# Patient Record
Sex: Female | Born: 1940 | Race: Black or African American | Hispanic: No | Marital: Married
Health system: Southern US, Community
[De-identification: ages and names within clinical notes are randomized; demographics above are authoritative.]

## PROBLEM LIST (undated history)

## (undated) DIAGNOSIS — I10 Essential (primary) hypertension: Secondary | ICD-10-CM

## (undated) DIAGNOSIS — R0989 Other specified symptoms and signs involving the circulatory and respiratory systems: Secondary | ICD-10-CM

## (undated) HISTORY — DX: Essential (primary) hypertension: I10

## (undated) HISTORY — DX: Other specified symptoms and signs involving the circulatory and respiratory systems: R09.89

## (undated) HISTORY — PX: PARTIAL HYSTERECTOMY: SHX80

---

## 2000-08-17 ENCOUNTER — Encounter: Admission: RE | Admit: 2000-08-17 | Discharge: 2000-08-17 | Payer: Self-pay | Admitting: Family Medicine

## 2000-08-17 ENCOUNTER — Encounter: Payer: Self-pay | Admitting: Family Medicine

## 2002-08-20 ENCOUNTER — Encounter: Payer: Self-pay | Admitting: Family Medicine

## 2002-08-20 ENCOUNTER — Encounter: Admission: RE | Admit: 2002-08-20 | Discharge: 2002-08-20 | Payer: Self-pay | Admitting: Family Medicine

## 2006-02-11 ENCOUNTER — Encounter: Admission: RE | Admit: 2006-02-11 | Discharge: 2006-02-11 | Payer: Self-pay | Admitting: Family Medicine

## 2006-04-01 ENCOUNTER — Encounter: Admission: RE | Admit: 2006-04-01 | Discharge: 2006-04-01 | Payer: Self-pay | Admitting: Family Medicine

## 2007-05-02 ENCOUNTER — Encounter: Admission: RE | Admit: 2007-05-02 | Discharge: 2007-05-02 | Payer: Self-pay | Admitting: Family Medicine

## 2008-05-23 ENCOUNTER — Encounter: Admission: RE | Admit: 2008-05-23 | Discharge: 2008-05-23 | Payer: Self-pay | Admitting: Family Medicine

## 2008-06-20 ENCOUNTER — Encounter: Admission: RE | Admit: 2008-06-20 | Discharge: 2008-06-20 | Payer: Self-pay | Admitting: Family Medicine

## 2009-12-08 ENCOUNTER — Encounter: Admission: RE | Admit: 2009-12-08 | Discharge: 2009-12-08 | Payer: Self-pay | Admitting: Family Medicine

## 2011-06-30 ENCOUNTER — Encounter: Payer: Self-pay | Admitting: Podiatry

## 2011-06-30 DIAGNOSIS — L03119 Cellulitis of unspecified part of limb: Secondary | ICD-10-CM | POA: Insufficient documentation

## 2011-06-30 DIAGNOSIS — R0989 Other specified symptoms and signs involving the circulatory and respiratory systems: Secondary | ICD-10-CM

## 2011-06-30 DIAGNOSIS — I10 Essential (primary) hypertension: Secondary | ICD-10-CM | POA: Insufficient documentation

## 2011-06-30 DIAGNOSIS — L02619 Cutaneous abscess of unspecified foot: Secondary | ICD-10-CM

## 2011-08-24 ENCOUNTER — Ambulatory Visit
Admission: RE | Admit: 2011-08-24 | Discharge: 2011-08-24 | Disposition: A | Discharge status: Home / Self Care | Payer: Medicare Other | Source: Ambulatory Visit | Attending: Family Medicine | Admitting: Family Medicine

## 2011-08-24 ENCOUNTER — Other Ambulatory Visit: Payer: Self-pay | Admitting: Family Medicine

## 2011-08-24 DIAGNOSIS — Z1231 Encounter for screening mammogram for malignant neoplasm of breast: Secondary | ICD-10-CM

## 2011-08-30 ENCOUNTER — Other Ambulatory Visit: Payer: Self-pay | Admitting: Family Medicine

## 2011-09-07 ENCOUNTER — Ambulatory Visit
Admission: RE | Admit: 2011-09-07 | Discharge: 2011-09-07 | Disposition: A | Discharge status: Home / Self Care | Payer: Medicare Other | Source: Ambulatory Visit | Attending: Family Medicine | Admitting: Family Medicine

## 2012-12-01 ENCOUNTER — Other Ambulatory Visit: Payer: Self-pay | Admitting: Family Medicine

## 2012-12-01 DIAGNOSIS — Z1231 Encounter for screening mammogram for malignant neoplasm of breast: Secondary | ICD-10-CM

## 2012-12-05 ENCOUNTER — Ambulatory Visit
Admission: RE | Admit: 2012-12-05 | Discharge: 2012-12-05 | Disposition: A | Discharge status: Home / Self Care | Payer: Medicare Other | Source: Ambulatory Visit | Attending: Family Medicine | Admitting: Family Medicine

## 2012-12-05 DIAGNOSIS — Z1231 Encounter for screening mammogram for malignant neoplasm of breast: Secondary | ICD-10-CM

## 2012-12-06 DIAGNOSIS — I10 Essential (primary) hypertension: Secondary | ICD-10-CM | POA: Diagnosis not present

## 2012-12-06 DIAGNOSIS — E7800 Pure hypercholesterolemia, unspecified: Secondary | ICD-10-CM | POA: Diagnosis not present

## 2012-12-06 DIAGNOSIS — E559 Vitamin D deficiency, unspecified: Secondary | ICD-10-CM | POA: Diagnosis not present

## 2012-12-06 DIAGNOSIS — Z23 Encounter for immunization: Secondary | ICD-10-CM | POA: Diagnosis not present

## 2012-12-06 DIAGNOSIS — Z79899 Other long term (current) drug therapy: Secondary | ICD-10-CM | POA: Diagnosis not present

## 2012-12-06 DIAGNOSIS — J309 Allergic rhinitis, unspecified: Secondary | ICD-10-CM | POA: Diagnosis not present

## 2012-12-12 ENCOUNTER — Ambulatory Visit: Payer: Medicare Other

## 2013-01-18 ENCOUNTER — Emergency Department (INDEPENDENT_AMBULATORY_CARE_PROVIDER_SITE_OTHER)
Admission: EM | Admit: 2013-01-18 | Discharge: 2013-01-18 | Disposition: A | Discharge status: Home / Self Care | Payer: Medicare Other | Source: Home / Self Care | Attending: Family Medicine | Admitting: Family Medicine

## 2013-01-18 ENCOUNTER — Encounter (HOSPITAL_COMMUNITY): Payer: Self-pay | Admitting: *Deleted

## 2013-01-18 DIAGNOSIS — K089 Disorder of teeth and supporting structures, unspecified: Secondary | ICD-10-CM

## 2013-01-18 MED ORDER — PENICILLIN V POTASSIUM 500 MG PO TABS: 500.0000 mg | ORAL_TABLET | Freq: Two times a day (BID) | ORAL | Status: AC

## 2013-01-18 MED ORDER — BENZOCAINE 20 % MT PSTE: 1.0000 "application " | PASTE | Freq: Three times a day (TID) | OROMUCOSAL | Status: DC | PRN

## 2013-01-18 MED ORDER — TRAMADOL HCL 50 MG PO TABS: 50.0000 mg | ORAL_TABLET | Freq: Four times a day (QID) | ORAL | Status: DC | PRN

## 2013-01-18 NOTE — ED Notes (Signed)
Pt  Reports  Symptoms  Of  Toothache     r  Lower  Side    She   Reports  The  Symptoms  Began yesterday    She  Has  Not  Seen a  Dentist  For these  Symptoms     She is  Sitting  uprihjt  On  Exam table  Speaking in  Complete  sentances

## 2013-01-18 NOTE — ED Provider Notes (Signed)
History     CSN: [id number]  Arrival date & time 01/18/13  1150   First MD Initiated Contact with Patient 01/18/13 1230      Chief Complaint  Patient presents with  . Dental Pain    (Consider location/radiation/quality/duration/timing/severity/associated sxs/prior treatment) HPI Comments: 72 year old female with history of hypertension. Here complaining of dental pain in her right lower jaw since yesterday. Patient states she was eating chicken and bit the bone causing one of her tooth break few days ago. There has been some debris accumulated in the cavity and start to feel pain yesterday. Denies swelling or drainage. Denies fever or chills. She tried to be seen in the dental office today but they didn't accept Medicare. Not taking any medication for pain.   Past Medical History  Diagnosis Date  . Sinus complaint   . Hypertension     Past Surgical History  Procedure Laterality Date  . Partial hysterectomy      No family history on file.  History  Substance Use Topics  . Smoking status: Unknown If Ever Smoked  . Smokeless tobacco: Not on file  . Alcohol Use: No    OB History   Grav Para Term Preterm Abortions TAB SAB Ect Mult Living                  Review of Systems  Constitutional: Negative for fever, chills and appetite change.  HENT: Negative for ear pain, sore throat, facial swelling and neck pain.   Respiratory: Negative for cough.   Neurological: Negative for headaches.    Allergies  Codeine and Tylenol  Home Medications   Current Outpatient Rx  Name  Route  Sig  Dispense  Refill  . benzocaine (ORABASE-B) 20 % PSTE   Mouth/Throat   Use as directed 1 application in the mouth or throat 3 (three) times daily as needed.   1 each   0   . HYDROCHLOROTHIAZIDE PO   Oral   Take by mouth.           Marland Kitchen LISINOPRIL PO   Oral   Take by mouth.           . Nutritional Supplements (VITAMIN D BOOSTER PO)   Oral   Take by mouth.           .  penicillin v potassium (VEETID) 500 MG tablet   Oral   Take 1 tablet (500 mg total) by mouth 2 (two) times daily before a meal.   14 tablet   0   . traMADol (ULTRAM) 50 MG tablet   Oral   Take 1 tablet (50 mg total) by mouth every 6 (six) hours as needed for pain.   15 tablet   0     BP 144/62  Pulse 76  Temp(Src) 98.5 F (36.9 C) (Oral)  Resp 16  SpO2 100%  Physical Exam  Nursing note and vitals reviewed. Constitutional: She is oriented to person, place, and time. She appears well-developed and well-nourished. No distress.  HENT:  Head: Normocephalic and atraumatic.  Right Ear: External ear normal.  Left Ear: External ear normal.  Mouth/Throat: Oropharynx is clear and moist. No oropharyngeal exudate.  There is a fractured molar in the right lower jaw with yellow exudate in the cavity and gum irritation around. No obvious fluctuation.   Neck: Neck supple. No thyromegaly present.  Cardiovascular: Normal heart sounds.   Pulmonary/Chest: Breath sounds normal.  Lymphadenopathy:    She has no cervical adenopathy.  Neurological: She is alert and oriented to person, place, and time.  Skin: No rash noted. She is not diaphoretic.    ED Course  Procedures (including critical care time)  Labs Reviewed - No data to display No results found.   1. Pain, dental       MDM  Decided to cover with penicillin for possible infection as already accumulating yellow debrie in the fractured cavity. Also prescribed tramadol and Orabase for pain. Arts development officer provided. Encouraged dental followup. Supportive care and red flags that should prompt her return to medical attention discussed with patient and provided in writing.        Sharin Grave, MD 01/20/13 1032

## 2013-06-14 ENCOUNTER — Other Ambulatory Visit: Payer: Self-pay | Admitting: Family Medicine

## 2013-06-14 DIAGNOSIS — M810 Age-related osteoporosis without current pathological fracture: Secondary | ICD-10-CM | POA: Diagnosis not present

## 2013-06-14 DIAGNOSIS — Z79899 Other long term (current) drug therapy: Secondary | ICD-10-CM | POA: Diagnosis not present

## 2013-06-14 DIAGNOSIS — E559 Vitamin D deficiency, unspecified: Secondary | ICD-10-CM | POA: Diagnosis not present

## 2013-06-14 DIAGNOSIS — I10 Essential (primary) hypertension: Secondary | ICD-10-CM | POA: Diagnosis not present

## 2013-06-14 DIAGNOSIS — Z1331 Encounter for screening for depression: Secondary | ICD-10-CM | POA: Diagnosis not present

## 2013-06-14 DIAGNOSIS — Z0000 Encounter for general adult medical examination without abnormal findings: Secondary | ICD-10-CM | POA: Diagnosis not present

## 2013-06-14 DIAGNOSIS — E7800 Pure hypercholesterolemia, unspecified: Secondary | ICD-10-CM | POA: Diagnosis not present

## 2013-08-22 DIAGNOSIS — E559 Vitamin D deficiency, unspecified: Secondary | ICD-10-CM | POA: Diagnosis not present

## 2013-09-07 ENCOUNTER — Ambulatory Visit
Admission: RE | Admit: 2013-09-07 | Discharge: 2013-09-07 | Disposition: A | Discharge status: Home / Self Care | Payer: Medicare Other | Source: Ambulatory Visit | Attending: Family Medicine | Admitting: Family Medicine

## 2013-09-07 DIAGNOSIS — M810 Age-related osteoporosis without current pathological fracture: Secondary | ICD-10-CM | POA: Diagnosis not present

## 2013-09-07 DIAGNOSIS — M899 Disorder of bone, unspecified: Secondary | ICD-10-CM | POA: Diagnosis not present

## 2013-10-31 DIAGNOSIS — K5730 Diverticulosis of large intestine without perforation or abscess without bleeding: Secondary | ICD-10-CM | POA: Diagnosis not present

## 2013-10-31 DIAGNOSIS — Z86010 Personal history of colonic polyps: Secondary | ICD-10-CM | POA: Diagnosis not present

## 2013-10-31 DIAGNOSIS — D126 Benign neoplasm of colon, unspecified: Secondary | ICD-10-CM | POA: Diagnosis not present

## 2013-10-31 DIAGNOSIS — Z09 Encounter for follow-up examination after completed treatment for conditions other than malignant neoplasm: Secondary | ICD-10-CM | POA: Diagnosis not present

## 2013-12-13 DIAGNOSIS — E7800 Pure hypercholesterolemia, unspecified: Secondary | ICD-10-CM | POA: Diagnosis not present

## 2013-12-13 DIAGNOSIS — M810 Age-related osteoporosis without current pathological fracture: Secondary | ICD-10-CM | POA: Diagnosis not present

## 2013-12-13 DIAGNOSIS — E559 Vitamin D deficiency, unspecified: Secondary | ICD-10-CM | POA: Diagnosis not present

## 2013-12-13 DIAGNOSIS — I10 Essential (primary) hypertension: Secondary | ICD-10-CM | POA: Diagnosis not present

## 2013-12-26 ENCOUNTER — Ambulatory Visit
Admission: RE | Admit: 2013-12-26 | Discharge: 2013-12-26 | Disposition: A | Discharge status: Home / Self Care | Payer: Medicare Other | Source: Ambulatory Visit

## 2013-12-26 ENCOUNTER — Other Ambulatory Visit: Payer: Self-pay

## 2013-12-26 DIAGNOSIS — Z1231 Encounter for screening mammogram for malignant neoplasm of breast: Secondary | ICD-10-CM

## 2014-04-24 ENCOUNTER — Encounter (HOSPITAL_COMMUNITY): Payer: Self-pay | Admitting: Emergency Medicine

## 2014-04-24 ENCOUNTER — Emergency Department (HOSPITAL_COMMUNITY)
Admission: EM | Admit: 2014-04-24 | Discharge: 2014-04-25 | Disposition: A | Discharge status: Home / Self Care | Payer: Medicare Other | Attending: Emergency Medicine | Admitting: Emergency Medicine

## 2014-04-24 DIAGNOSIS — I10 Essential (primary) hypertension: Secondary | ICD-10-CM | POA: Diagnosis not present

## 2014-04-24 DIAGNOSIS — K029 Dental caries, unspecified: Secondary | ICD-10-CM | POA: Insufficient documentation

## 2014-04-24 DIAGNOSIS — K089 Disorder of teeth and supporting structures, unspecified: Secondary | ICD-10-CM | POA: Diagnosis not present

## 2014-04-24 DIAGNOSIS — Z79899 Other long term (current) drug therapy: Secondary | ICD-10-CM | POA: Diagnosis not present

## 2014-04-24 DIAGNOSIS — K0889 Other specified disorders of teeth and supporting structures: Secondary | ICD-10-CM

## 2014-04-24 NOTE — ED Notes (Signed)
Pt. reports right low molar pain onset this morning .

## 2014-04-24 NOTE — ED Provider Notes (Signed)
CSN: [id number]     Arrival date & time 04/24/14  2234 History  This chart was scribed for non-physician practitioner Monico Blitz, PA-C working with [place], DO by Rolanda Lundborg, ED Scribe. This patient was seen in room TR09C/TR09C and the patient's care was started at 11:49 PM.    Chief Complaint  Patient presents with  . Dental Pain   The history is provided by the patient. No language interpreter was used.   HPI Comments: Kathy Bond is a 73 y.o. female who presents to the Emergency Department complaining of severe, gradually-worsening right lower molar pain since yesterday. She took an Advil this morning and prescription pain medicine this afternoon. She is allergic to tylenol and codeine. Denies fever/chills, difficulty opening jaw, difficulty swallowing, SOB, gum swelling, facial swelling, neck swelling.     Past Medical History  Diagnosis Date  . Sinus complaint   . Hypertension    Past Surgical History  Procedure Laterality Date  . Partial hysterectomy     No family history on file. History  Substance Use Topics  . Smoking status: Unknown If Ever Smoked  . Smokeless tobacco: Not on file  . Alcohol Use: No   OB History   Grav Para Term Preterm Abortions TAB SAB Ect Mult Living                 Review of Systems  Constitutional: Negative for fever.  HENT: Positive for dental problem.   All other systems reviewed and are negative.     Allergies  Codeine and Tylenol  Home Medications   Prior to Admission medications   Medication Sig Start Date End Date Taking? Authorizing Provider  LISINOPRIL PO Take by mouth.      Historical Provider, MD   BP 179/76  Pulse 80  Temp(Src) 98.5 F (36.9 C) (Oral)  Resp 20  SpO2 99% Physical Exam  Nursing note and vitals reviewed. Constitutional: She is oriented to person, place, and time. She appears well-developed and well-nourished. No distress.  HENT:  Head: Normocephalic.  Mouth/Throat:     Generally good dentition, no gingival swelling, erythema or tenderness to palpation. Patient is handling their secretions. There is no tenderness to palpation or firmness underneath tongue bilaterally. No trismus.    Eyes: Conjunctivae and EOM are normal.  Cardiovascular: Normal rate.   Pulmonary/Chest: Effort normal. No stridor.  Musculoskeletal: Normal range of motion.  Neurological: She is alert and oriented to person, place, and time.  Psychiatric: She has a normal mood and affect.    ED Course  Procedures (including critical care time) Medications  amoxicillin (AMOXIL) capsule 500 mg (500 mg Oral Given 04/25/14 0118)  oxyCODONE (Oxy IR/ROXICODONE) immediate release tablet 2.5 mg (2.5 mg Oral Given 04/25/14 0118)    DIAGNOSTIC STUDIES: Oxygen Saturation is 99% on RA, normal by my interpretation.    COORDINATION OF CARE: 12:00 AM- Discussed treatment plan with pt which includes discharge home with antibiotics and pain medication. Pt agrees to plan.    Labs Review Labs Reviewed - No data to display  Imaging Review No results found.   EKG Interpretation None      MDM   Final diagnoses:  Pain, dental    Filed Vitals:   04/24/14 2239 04/25/14 0121  BP: 179/76 171/87  Pulse: 80 62  Temp: 98.5 F (36.9 C) 98.4 F (36.9 C)  TempSrc: Oral Oral  Resp: 20 16  SpO2: 99% 99%    Medications  amoxicillin (AMOXIL) capsule  500 mg (500 mg Oral Given 04/25/14 0118)  oxyCODONE (Oxy IR/ROXICODONE) immediate release tablet 2.5 mg (2.5 mg Oral Given 04/25/14 0118)    Kathy Bond is a 73 y.o. female presenting with dental pain associated with dental cary but no signs or symptoms of dental abscess. Patient afebrile, non toxic appearing and swallowing secretions well. I gave patient referral to dentist and stressed the importance of dental follow up for ultimate management of dental pain. Patient voices understanding and is agreeable to plan.  Evaluation does not show  pathology that would require ongoing emergent intervention or inpatient treatment. Pt is hemodynamically stable and mentating appropriately. Discussed findings and plan with patient/guardian, who agrees with care plan. All questions answered. Return precautions discussed and outpatient follow up given.   Discharge Medication List as of 04/25/2014 12:30 AM    START taking these medications   Details  amoxicillin (AMOXIL) 500 MG capsule Take 1 capsule (500 mg total) by mouth 3 (three) times daily., Starting 04/25/2014, Until Discontinued, Print    oxyCODONE (ROXICODONE) 5 MG immediate release tablet Take 0.5 tablets (2.5 mg total) by mouth every 4 (four) hours as needed for severe pain., Starting 04/25/2014, Until Discontinued, Print        Note: Portions of this report may have been transcribed using voice recognition software. Every effort was made to ensure accuracy; however, inadvertent computerized transcription errors may be present   I personally performed the services described in this documentation, which was scribed in my presence. The recorded information has been reviewed and is accurate.   Monico Blitz, PA-C 04/25/14 [phone]

## 2014-04-25 MED ORDER — OXYCODONE HCL 5 MG PO TABS: 2.5000 mg | ORAL_TABLET | ORAL | Status: DC | PRN

## 2014-04-25 MED ORDER — AMOXICILLIN 500 MG PO CAPS: 500.0000 mg | ORAL_CAPSULE | Freq: Three times a day (TID) | ORAL | Status: DC

## 2014-04-25 MED ORDER — AMOXICILLIN 500 MG PO CAPS
500.0000 mg | ORAL_CAPSULE | Freq: Once | ORAL | Status: AC
  Administered 2014-04-25: 500 mg via ORAL
  Filled 2014-04-25: qty 1

## 2014-04-25 MED ORDER — OXYCODONE HCL 5 MG PO TABS
2.5000 mg | ORAL_TABLET | Freq: Once | ORAL | Status: AC
  Administered 2014-04-25: 2.5 mg via ORAL
  Filled 2014-04-25: qty 1

## 2014-04-25 NOTE — Discharge Instructions (Signed)
Take oxycodone for breakthrough pain, do not drink alcohol, drive, care for children or do other critical tasks while taking oxycodone.  Return to the emergency room for fever, change in vision, redness to the face that rapidly spreads towards the eye, nausea or vomiting, difficulty swallowing or shortness of breath.   Apply warm compresses to jaw throughout the day.    Take your antibiotics as directed and to the end of the course.   Followup with a dentist is very important for ongoing evaluation and management of recurrent dental pain. Return to emergency department for emergent changing or worsening symptoms."  Low-cost dental clinic: Kathy Bond  at [phone]**  **Kathy Bond at [phone] [address]**    You may also call [phone]  Dental Assistance If the dentist on-call cannot see you, please use the resources below:   Patients with Medicaid: [facility] [id number], [place] [address] [phone]  If unable to pay, or uninsured, contact [facility] [phone]) or [facility] [phone] in [place], [place] in [facility]) to become qualified for the adult dental clinic  Other [place] [place], [place], [place], [address]    [phone], Ext. [phone]    2nd and 4th Thursday of the month at 6:30am    10 clients each day by appointment, can sometimes see walk-in     patients if someone does not show for an appointment [place], [place], [place], [address]    [phone] [facility]- [address], [place], [place], [address]    [phone]  [place] Department- [phone] [place] [facility] Department- [phone]

## 2014-04-27 NOTE — ED Provider Notes (Signed)
Medical screening examination/treatment/procedure(s) were performed by non-physician practitioner and as supervising physician I was immediately available for consultation/collaboration.   EKG Interpretation None        Anye Brose N Carlon Davidson, DO 04/27/14 0709 

## 2014-07-08 DIAGNOSIS — I10 Essential (primary) hypertension: Secondary | ICD-10-CM | POA: Diagnosis not present

## 2014-07-08 DIAGNOSIS — Z0000 Encounter for general adult medical examination without abnormal findings: Secondary | ICD-10-CM | POA: Diagnosis not present

## 2014-07-08 DIAGNOSIS — E7800 Pure hypercholesterolemia, unspecified: Secondary | ICD-10-CM | POA: Diagnosis not present

## 2014-07-08 DIAGNOSIS — Z1331 Encounter for screening for depression: Secondary | ICD-10-CM | POA: Diagnosis not present

## 2014-07-08 DIAGNOSIS — E559 Vitamin D deficiency, unspecified: Secondary | ICD-10-CM | POA: Diagnosis not present

## 2014-07-08 DIAGNOSIS — Z23 Encounter for immunization: Secondary | ICD-10-CM | POA: Diagnosis not present

## 2015-01-09 DIAGNOSIS — I10 Essential (primary) hypertension: Secondary | ICD-10-CM | POA: Diagnosis not present

## 2015-05-12 ENCOUNTER — Other Ambulatory Visit: Payer: Self-pay

## 2015-05-12 DIAGNOSIS — Z1231 Encounter for screening mammogram for malignant neoplasm of breast: Secondary | ICD-10-CM

## 2015-06-06 ENCOUNTER — Ambulatory Visit
Admission: RE | Admit: 2015-06-06 | Discharge: 2015-06-06 | Disposition: A | Discharge status: Home / Self Care | Payer: Medicare Other | Source: Ambulatory Visit

## 2015-06-06 ENCOUNTER — Ambulatory Visit: Payer: Medicare Other

## 2015-06-06 DIAGNOSIS — Z1231 Encounter for screening mammogram for malignant neoplasm of breast: Secondary | ICD-10-CM

## 2015-07-17 DIAGNOSIS — I10 Essential (primary) hypertension: Secondary | ICD-10-CM | POA: Diagnosis not present

## 2015-07-17 DIAGNOSIS — Z0000 Encounter for general adult medical examination without abnormal findings: Secondary | ICD-10-CM | POA: Diagnosis not present

## 2015-07-17 DIAGNOSIS — E559 Vitamin D deficiency, unspecified: Secondary | ICD-10-CM | POA: Diagnosis not present

## 2015-07-17 DIAGNOSIS — Z1389 Encounter for screening for other disorder: Secondary | ICD-10-CM | POA: Diagnosis not present

## 2015-07-17 DIAGNOSIS — M810 Age-related osteoporosis without current pathological fracture: Secondary | ICD-10-CM | POA: Diagnosis not present

## 2015-07-23 ENCOUNTER — Other Ambulatory Visit: Payer: Self-pay | Admitting: Family Medicine

## 2015-07-23 DIAGNOSIS — M810 Age-related osteoporosis without current pathological fracture: Secondary | ICD-10-CM

## 2015-09-17 ENCOUNTER — Ambulatory Visit
Admission: RE | Admit: 2015-09-17 | Discharge: 2015-09-17 | Disposition: A | Discharge status: Home / Self Care | Payer: Medicare Other | Source: Ambulatory Visit | Attending: Family Medicine | Admitting: Family Medicine

## 2015-09-17 DIAGNOSIS — M810 Age-related osteoporosis without current pathological fracture: Secondary | ICD-10-CM

## 2015-10-20 ENCOUNTER — Ambulatory Visit
Admission: RE | Admit: 2015-10-20 | Discharge: 2015-10-20 | Disposition: A | Discharge status: Home / Self Care | Payer: Medicare Other | Source: Ambulatory Visit | Attending: Family Medicine | Admitting: Family Medicine

## 2015-10-20 DIAGNOSIS — M810 Age-related osteoporosis without current pathological fracture: Secondary | ICD-10-CM | POA: Diagnosis not present

## 2015-10-21 DIAGNOSIS — E876 Hypokalemia: Secondary | ICD-10-CM | POA: Diagnosis not present

## 2016-01-19 DIAGNOSIS — I10 Essential (primary) hypertension: Secondary | ICD-10-CM | POA: Diagnosis not present

## 2016-02-27 DIAGNOSIS — E876 Hypokalemia: Secondary | ICD-10-CM | POA: Diagnosis not present

## 2016-04-15 ENCOUNTER — Ambulatory Visit (INDEPENDENT_AMBULATORY_CARE_PROVIDER_SITE_OTHER): Payer: Medicare Other | Admitting: Podiatry

## 2016-04-15 ENCOUNTER — Encounter: Payer: Self-pay | Admitting: Podiatry

## 2016-04-15 ENCOUNTER — Ambulatory Visit (INDEPENDENT_AMBULATORY_CARE_PROVIDER_SITE_OTHER): Payer: Medicare Other

## 2016-04-15 VITALS — BP 124/69 | HR 66 | Resp 16 | Ht 66.0 in | Wt 154.0 lb

## 2016-04-15 DIAGNOSIS — M79672 Pain in left foot: Secondary | ICD-10-CM

## 2016-04-15 DIAGNOSIS — M21619 Bunion of unspecified foot: Secondary | ICD-10-CM | POA: Diagnosis not present

## 2016-04-15 DIAGNOSIS — M79671 Pain in right foot: Secondary | ICD-10-CM

## 2016-04-15 DIAGNOSIS — M1000 Idiopathic gout, unspecified site: Secondary | ICD-10-CM

## 2016-04-15 DIAGNOSIS — M779 Enthesopathy, unspecified: Secondary | ICD-10-CM

## 2016-04-15 MED ORDER — TRIAMCINOLONE ACETONIDE 10 MG/ML IJ SUSP
10.0000 mg | Freq: Once | INTRAMUSCULAR | Status: AC
  Administered 2016-04-15: 10 mg

## 2016-04-15 NOTE — Progress Notes (Signed)
Subjective:     Patient ID: Kathy Bond, female   DOB: 04-12-1941, 75 y.o.   MRN: [id number]  HPI patient presents with pain around the big toe joint right with inflammation fluid buildup and mild discomfort around the left first MPJ with inflammation fluid buildup noted   Review of Systems  All other systems reviewed and are negative.      Objective:   Physical Exam  Constitutional: She is oriented to person, place, and time.  Cardiovascular: Intact distal pulses.   Musculoskeletal: Normal range of motion.  Neurological: She is oriented to person, place, and time.  Skin: Skin is warm.  Nursing note and vitals reviewed.  neurovascular status intact muscle strength adequate range of motion within normal limits with patient found to have inflammation and fluid around the first MPJ right foot that's painful when pressed with minimal changes in the left foot. Patient states that at times it hard to wear shoe gear comfortably and she's never had anything like this before. Good digital perfusion was noted and well oriented     Assessment:     Inflammatory capsulitis with strong probability of gout-like symptoms right first MPJ    Plan:     H&P and x-rays reviewed with patient. Today I injected around the joint 3 mg Kenalog 5 mill grams Xylocaine and gave her instructions on foods to avoid with her gout. If this persists we will get blood work and we may need to consider oral medication if she were to get another attack  X-ray report indicates fluid buildup around the first MPJ right with mild elevation of the intermetatarsal angle

## 2016-04-15 NOTE — Progress Notes (Signed)
   Subjective:    Patient ID: Kathy Bond, female    DOB: [birth year], 75 y.o.   MRN: [id number]  HPI Chief Complaint  Patient presents with  . Toe Pain    Right foot; great toe-joint; swelling and redness      Review of Systems  HENT: Positive for sinus pressure.   All other systems reviewed and are negative.      Objective:   Physical Exam        Assessment & Plan:

## 2016-07-20 ENCOUNTER — Other Ambulatory Visit: Payer: Self-pay | Admitting: Family Medicine

## 2016-07-20 DIAGNOSIS — Z1231 Encounter for screening mammogram for malignant neoplasm of breast: Secondary | ICD-10-CM

## 2016-07-22 DIAGNOSIS — E559 Vitamin D deficiency, unspecified: Secondary | ICD-10-CM | POA: Diagnosis not present

## 2016-07-22 DIAGNOSIS — M109 Gout, unspecified: Secondary | ICD-10-CM | POA: Diagnosis not present

## 2016-07-22 DIAGNOSIS — I10 Essential (primary) hypertension: Secondary | ICD-10-CM | POA: Diagnosis not present

## 2016-07-22 DIAGNOSIS — M810 Age-related osteoporosis without current pathological fracture: Secondary | ICD-10-CM | POA: Diagnosis not present

## 2016-07-22 DIAGNOSIS — Z0000 Encounter for general adult medical examination without abnormal findings: Secondary | ICD-10-CM | POA: Diagnosis not present

## 2016-07-23 ENCOUNTER — Ambulatory Visit
Admission: RE | Admit: 2016-07-23 | Discharge: 2016-07-23 | Disposition: A | Discharge status: Home / Self Care | Payer: Medicare Other | Source: Ambulatory Visit | Attending: Family Medicine | Admitting: Family Medicine

## 2016-07-23 DIAGNOSIS — Z1231 Encounter for screening mammogram for malignant neoplasm of breast: Secondary | ICD-10-CM

## 2016-08-30 DIAGNOSIS — I10 Essential (primary) hypertension: Secondary | ICD-10-CM | POA: Diagnosis not present

## 2016-08-30 DIAGNOSIS — M109 Gout, unspecified: Secondary | ICD-10-CM | POA: Diagnosis not present

## 2016-09-29 DIAGNOSIS — M109 Gout, unspecified: Secondary | ICD-10-CM | POA: Diagnosis not present

## 2016-09-29 DIAGNOSIS — I10 Essential (primary) hypertension: Secondary | ICD-10-CM | POA: Diagnosis not present

## 2016-10-27 DIAGNOSIS — M109 Gout, unspecified: Secondary | ICD-10-CM | POA: Diagnosis not present

## 2016-12-02 DIAGNOSIS — I10 Essential (primary) hypertension: Secondary | ICD-10-CM | POA: Diagnosis not present

## 2017-01-27 DIAGNOSIS — I10 Essential (primary) hypertension: Secondary | ICD-10-CM | POA: Diagnosis not present

## 2017-03-30 DIAGNOSIS — I10 Essential (primary) hypertension: Secondary | ICD-10-CM | POA: Diagnosis not present

## 2017-04-28 DIAGNOSIS — I10 Essential (primary) hypertension: Secondary | ICD-10-CM | POA: Diagnosis not present

## 2017-04-28 DIAGNOSIS — M109 Gout, unspecified: Secondary | ICD-10-CM | POA: Diagnosis not present

## 2017-08-03 ENCOUNTER — Other Ambulatory Visit: Payer: Self-pay | Admitting: Family Medicine

## 2017-08-03 DIAGNOSIS — M1A9XX0 Chronic gout, unspecified, without tophus (tophi): Secondary | ICD-10-CM | POA: Diagnosis not present

## 2017-08-03 DIAGNOSIS — I10 Essential (primary) hypertension: Secondary | ICD-10-CM | POA: Diagnosis not present

## 2017-08-03 DIAGNOSIS — Z1231 Encounter for screening mammogram for malignant neoplasm of breast: Secondary | ICD-10-CM

## 2017-08-03 DIAGNOSIS — Z0000 Encounter for general adult medical examination without abnormal findings: Secondary | ICD-10-CM | POA: Diagnosis not present

## 2017-08-03 DIAGNOSIS — E559 Vitamin D deficiency, unspecified: Secondary | ICD-10-CM | POA: Diagnosis not present

## 2017-08-03 DIAGNOSIS — M810 Age-related osteoporosis without current pathological fracture: Secondary | ICD-10-CM | POA: Diagnosis not present

## 2017-08-04 ENCOUNTER — Other Ambulatory Visit: Payer: Self-pay | Admitting: Family Medicine

## 2017-08-04 DIAGNOSIS — M810 Age-related osteoporosis without current pathological fracture: Secondary | ICD-10-CM

## 2017-08-18 ENCOUNTER — Ambulatory Visit
Admission: RE | Admit: 2017-08-18 | Discharge: 2017-08-18 | Disposition: A | Discharge status: Home / Self Care | Payer: Medicare Other | Source: Ambulatory Visit | Attending: Family Medicine | Admitting: Family Medicine

## 2017-08-18 DIAGNOSIS — Z1231 Encounter for screening mammogram for malignant neoplasm of breast: Secondary | ICD-10-CM | POA: Diagnosis not present

## 2017-10-24 ENCOUNTER — Ambulatory Visit
Admission: RE | Admit: 2017-10-24 | Discharge: 2017-10-24 | Disposition: A | Discharge status: Home / Self Care | Payer: Medicare Other | Source: Ambulatory Visit | Attending: Family Medicine | Admitting: Family Medicine

## 2017-10-24 DIAGNOSIS — M810 Age-related osteoporosis without current pathological fracture: Secondary | ICD-10-CM | POA: Diagnosis not present

## 2017-10-24 DIAGNOSIS — Z780 Asymptomatic menopausal state: Secondary | ICD-10-CM | POA: Diagnosis not present

## 2018-02-09 DIAGNOSIS — I10 Essential (primary) hypertension: Secondary | ICD-10-CM | POA: Diagnosis not present

## 2018-02-09 DIAGNOSIS — E559 Vitamin D deficiency, unspecified: Secondary | ICD-10-CM | POA: Diagnosis not present

## 2018-02-09 DIAGNOSIS — M810 Age-related osteoporosis without current pathological fracture: Secondary | ICD-10-CM | POA: Diagnosis not present

## 2018-02-09 DIAGNOSIS — M109 Gout, unspecified: Secondary | ICD-10-CM | POA: Diagnosis not present

## 2018-08-17 DIAGNOSIS — M109 Gout, unspecified: Secondary | ICD-10-CM | POA: Diagnosis not present

## 2018-08-17 DIAGNOSIS — E559 Vitamin D deficiency, unspecified: Secondary | ICD-10-CM | POA: Diagnosis not present

## 2018-08-17 DIAGNOSIS — M810 Age-related osteoporosis without current pathological fracture: Secondary | ICD-10-CM | POA: Diagnosis not present

## 2018-08-17 DIAGNOSIS — Z0000 Encounter for general adult medical examination without abnormal findings: Secondary | ICD-10-CM | POA: Diagnosis not present

## 2018-08-17 DIAGNOSIS — I10 Essential (primary) hypertension: Secondary | ICD-10-CM | POA: Diagnosis not present

## 2018-10-23 DIAGNOSIS — J3089 Other allergic rhinitis: Secondary | ICD-10-CM | POA: Diagnosis not present

## 2018-10-23 DIAGNOSIS — S0340XA Sprain of jaw, unspecified side, initial encounter: Secondary | ICD-10-CM | POA: Diagnosis not present

## 2018-11-20 ENCOUNTER — Other Ambulatory Visit: Payer: Self-pay | Admitting: Family Medicine

## 2018-11-20 DIAGNOSIS — Z1231 Encounter for screening mammogram for malignant neoplasm of breast: Secondary | ICD-10-CM

## 2018-12-26 ENCOUNTER — Other Ambulatory Visit: Payer: Self-pay | Admitting: Family Medicine

## 2018-12-26 ENCOUNTER — Ambulatory Visit
Admission: RE | Admit: 2018-12-26 | Discharge: 2018-12-26 | Disposition: A | Discharge status: Home / Self Care | Payer: Medicare Other | Source: Ambulatory Visit | Attending: Family Medicine | Admitting: Family Medicine

## 2018-12-26 DIAGNOSIS — Z1231 Encounter for screening mammogram for malignant neoplasm of breast: Secondary | ICD-10-CM

## 2019-02-19 DIAGNOSIS — M810 Age-related osteoporosis without current pathological fracture: Secondary | ICD-10-CM | POA: Diagnosis not present

## 2019-02-19 DIAGNOSIS — M109 Gout, unspecified: Secondary | ICD-10-CM | POA: Diagnosis not present

## 2019-02-19 DIAGNOSIS — E559 Vitamin D deficiency, unspecified: Secondary | ICD-10-CM | POA: Diagnosis not present

## 2019-02-19 DIAGNOSIS — I10 Essential (primary) hypertension: Secondary | ICD-10-CM | POA: Diagnosis not present

## 2019-09-04 DIAGNOSIS — I10 Essential (primary) hypertension: Secondary | ICD-10-CM | POA: Diagnosis not present

## 2019-09-04 DIAGNOSIS — M109 Gout, unspecified: Secondary | ICD-10-CM | POA: Diagnosis not present

## 2019-09-04 DIAGNOSIS — Z23 Encounter for immunization: Secondary | ICD-10-CM | POA: Diagnosis not present

## 2019-09-04 DIAGNOSIS — E559 Vitamin D deficiency, unspecified: Secondary | ICD-10-CM | POA: Diagnosis not present

## 2019-09-04 DIAGNOSIS — M810 Age-related osteoporosis without current pathological fracture: Secondary | ICD-10-CM | POA: Diagnosis not present

## 2019-09-04 DIAGNOSIS — Z0000 Encounter for general adult medical examination without abnormal findings: Secondary | ICD-10-CM | POA: Diagnosis not present

## 2019-09-06 ENCOUNTER — Other Ambulatory Visit: Payer: Self-pay | Admitting: Family Medicine

## 2019-09-06 DIAGNOSIS — M810 Age-related osteoporosis without current pathological fracture: Secondary | ICD-10-CM

## 2020-02-08 ENCOUNTER — Other Ambulatory Visit: Payer: Self-pay | Admitting: Family Medicine

## 2020-02-08 DIAGNOSIS — Z1231 Encounter for screening mammogram for malignant neoplasm of breast: Secondary | ICD-10-CM

## 2020-09-17 ENCOUNTER — Other Ambulatory Visit: Payer: Self-pay | Admitting: Family Medicine

## 2020-09-17 DIAGNOSIS — M810 Age-related osteoporosis without current pathological fracture: Secondary | ICD-10-CM

## 2020-09-17 DIAGNOSIS — Z1231 Encounter for screening mammogram for malignant neoplasm of breast: Secondary | ICD-10-CM

## 2021-03-14 ENCOUNTER — Encounter (HOSPITAL_COMMUNITY): Payer: Self-pay | Admitting: *Deleted

## 2021-03-14 ENCOUNTER — Other Ambulatory Visit: Payer: Self-pay

## 2021-03-14 ENCOUNTER — Ambulatory Visit (HOSPITAL_COMMUNITY)
Admission: EM | Admit: 2021-03-14 | Discharge: 2021-03-14 | Disposition: A | Discharge status: Home / Self Care | Payer: Medicare Other | Attending: Medical Oncology | Admitting: Medical Oncology

## 2021-03-14 DIAGNOSIS — L2389 Allergic contact dermatitis due to other agents: Secondary | ICD-10-CM | POA: Diagnosis not present

## 2021-03-14 MED ORDER — TRIAMCINOLONE ACETONIDE 0.1 % EX CREA: 1.0000 "application " | TOPICAL_CREAM | Freq: Two times a day (BID) | CUTANEOUS | 0 refills | Status: AC

## 2021-03-14 NOTE — ED Provider Notes (Signed)
[place]    CSN: [id number] Arrival date & time: 03/14/21  1450      History   Chief Complaint Chief Complaint  Patient presents with  . skin irritation    HPI Kathy Bond is a 80 y.o. female.   HPI   Skin Rash: Pt reports that yesterday while working in the yard she started having hives and itching. Started after moving the grass. Rash occurred on legs, arms and trunk. Symptoms resolved with calamine lotion. She is concerned about Shingles so she comes for an evaluation. She denies facial swelling, trouble breathing, swallowing, SOB, wheezing.   Past Medical History:  Diagnosis Date  . Hypertension   . Sinus complaint     Patient Active Problem List   Diagnosis Date Noted  . Hypertension 06/30/2011  . Sinus complaint 06/30/2011  . Cellulitis and abscess of foot 06/30/2011    Past Surgical History:  Procedure Laterality Date  . PARTIAL HYSTERECTOMY      OB History   No obstetric history on file.      Home Medications    Prior to Admission medications   Medication Sig Start Date End Date Taking? Authorizing Provider  hydrochlorothiazide (HYDRODIURIL) 25 MG tablet Take 25 mg by mouth daily.    [provider]  ibuprofen (ADVIL,MOTRIN) 200 MG tablet Take 200 mg by mouth every 6 (six) hours as needed for moderate pain.    [provider]  LISINOPRIL PO Take 1 tablet by mouth daily. Pt takes Lisinopril HCTZ 20-25 mg    [provider]  potassium chloride (MICRO-K) 10 MEQ CR capsule Take 10 mEq by mouth once a week.    [provider]  traMADol (ULTRAM) 50 MG tablet Take 50 mg by mouth every 6 (six) hours as needed for moderate pain.    [provider]  Vitamin D, Ergocalciferol, (DRISDOL) 50000 UNITS CAPS capsule Take 50,000 Units by mouth every 7 (seven) days. Mondays    [provider]    Family History Family History  Problem Relation Age of Onset  . Breast cancer Neg Hx     Social  History Social History   Tobacco Use  . Smoking status: Unknown If Ever Smoked  . Smokeless tobacco: Never Used  Substance Use Topics  . Alcohol use: No     Allergies   Codeine and Tylenol [acetaminophen]   Review of Systems Review of Systems  As stated above in HPI Physical Exam Triage Vital Signs ED Triage Vitals  Enc Vitals Group     BP 03/14/21 1522 (!) 151/68     Pulse Rate 03/14/21 1522 69     Resp 03/14/21 1522 18     Temp 03/14/21 1522 98.4 F (36.9 C)     Temp Source 03/14/21 1522 Oral     SpO2 03/14/21 1522 100 %     Weight --      Height --      Head Circumference --      Peak Flow --      Pain Score 03/14/21 1520 0     Pain Loc --      Pain Edu? --      Excl. in [place]? --    No data found.  Updated Vital Signs BP (!) 151/68 (BP Location: Right Arm)   Pulse 69   Temp 98.4 F (36.9 C) (Oral)   Resp 18   SpO2 100%   Physical Exam Vitals and nursing note reviewed.  Constitutional:      Appearance: Normal appearance.  Cardiovascular:     Rate and Rhythm: Normal rate and regular rhythm.     Heart sounds: Normal heart sounds.  Pulmonary:     Effort: Pulmonary effort is normal.     Breath sounds: Normal breath sounds.  Skin:    Findings: No rash.  Neurological:     Mental Status: She is alert.     UC Treatments / Results  Labs (all labs ordered are listed, but only abnormal results are displayed) Labs Reviewed - No data to display  EKG   Radiology No results found.  Procedures Procedures (including critical care time)  Medications Ordered in UC Medications - No data to display  Initial Impression / Assessment and Plan / UC Course  I have reviewed the triage vital signs and the nursing notes.  Pertinent labs & imaging results that were available during my care of the patient were reviewed by me and considered in my medical decision making (see chart for details).     New. Discussed contact dermatitis which is likely given  pollen and allergy season paired with history. Symptoms have resolved. Discussed red flag signs and symptoms. Triamcinolone should symptoms return. She could also trial Claritin if needed. Discussed shingles red flag signs and symptoms.   Final Clinical Impressions(s) / UC Diagnoses   Final diagnoses:  None   Discharge Instructions   None    ED Prescriptions    None     PDMP not reviewed this encounter.   Hughie Closs, [place] 03/14/21 1558

## 2021-03-14 NOTE — ED Triage Notes (Signed)
Pt reports after working in the yard yesterday having hives and itching. This has resolved today but wants to be checked out.

## 2021-05-15 ENCOUNTER — Ambulatory Visit
Admission: RE | Admit: 2021-05-15 | Discharge: 2021-05-15 | Disposition: A | Discharge status: Home / Self Care | Payer: Medicare Other | Source: Ambulatory Visit | Attending: Family Medicine | Admitting: Family Medicine

## 2021-05-15 ENCOUNTER — Other Ambulatory Visit: Payer: Self-pay | Admitting: Family Medicine

## 2021-05-15 DIAGNOSIS — R634 Abnormal weight loss: Secondary | ICD-10-CM

## 2021-05-15 DIAGNOSIS — M1A0710 Idiopathic chronic gout, right ankle and foot, without tophus (tophi): Secondary | ICD-10-CM | POA: Diagnosis not present

## 2021-05-29 ENCOUNTER — Other Ambulatory Visit (HOSPITAL_BASED_OUTPATIENT_CLINIC_OR_DEPARTMENT_OTHER): Payer: Self-pay | Admitting: Family Medicine

## 2021-05-29 DIAGNOSIS — R634 Abnormal weight loss: Secondary | ICD-10-CM

## 2021-06-02 ENCOUNTER — Other Ambulatory Visit: Payer: Self-pay

## 2021-06-02 ENCOUNTER — Encounter (HOSPITAL_BASED_OUTPATIENT_CLINIC_OR_DEPARTMENT_OTHER): Payer: Self-pay | Admitting: Radiology

## 2021-06-02 ENCOUNTER — Ambulatory Visit (HOSPITAL_BASED_OUTPATIENT_CLINIC_OR_DEPARTMENT_OTHER)
Admission: RE | Admit: 2021-06-02 | Discharge: 2021-06-02 | Disposition: A | Discharge status: Home / Self Care | Payer: Medicare Other | Source: Ambulatory Visit | Attending: Family Medicine | Admitting: Family Medicine

## 2021-06-02 ENCOUNTER — Other Ambulatory Visit (HOSPITAL_BASED_OUTPATIENT_CLINIC_OR_DEPARTMENT_OTHER): Payer: Self-pay | Admitting: Family Medicine

## 2021-06-02 DIAGNOSIS — J984 Other disorders of lung: Secondary | ICD-10-CM | POA: Diagnosis not present

## 2021-06-02 DIAGNOSIS — E279 Disorder of adrenal gland, unspecified: Secondary | ICD-10-CM | POA: Diagnosis not present

## 2021-06-02 DIAGNOSIS — I700 Atherosclerosis of aorta: Secondary | ICD-10-CM | POA: Diagnosis not present

## 2021-06-02 DIAGNOSIS — R634 Abnormal weight loss: Secondary | ICD-10-CM | POA: Diagnosis not present

## 2021-06-02 MED ORDER — IOHEXOL 300 MG/ML  SOLN
80.0000 mL | Freq: Once | INTRAMUSCULAR | Status: AC | PRN
  Administered 2021-06-02: 11:00:00 80 mL via INTRAVENOUS

## 2021-06-09 ENCOUNTER — Other Ambulatory Visit: Payer: Self-pay | Admitting: Family Medicine

## 2021-06-09 DIAGNOSIS — E279 Disorder of adrenal gland, unspecified: Secondary | ICD-10-CM

## 2021-06-22 ENCOUNTER — Other Ambulatory Visit: Payer: Self-pay

## 2021-06-22 ENCOUNTER — Ambulatory Visit
Admission: RE | Admit: 2021-06-22 | Discharge: 2021-06-22 | Disposition: A | Discharge status: Home / Self Care | Payer: Medicare Other | Source: Ambulatory Visit | Attending: Family Medicine | Admitting: Family Medicine

## 2021-06-22 DIAGNOSIS — E279 Disorder of adrenal gland, unspecified: Secondary | ICD-10-CM

## 2021-06-22 DIAGNOSIS — K7689 Other specified diseases of liver: Secondary | ICD-10-CM | POA: Diagnosis not present

## 2021-06-22 DIAGNOSIS — N281 Cyst of kidney, acquired: Secondary | ICD-10-CM | POA: Diagnosis not present

## 2021-06-22 DIAGNOSIS — E278 Other specified disorders of adrenal gland: Secondary | ICD-10-CM | POA: Diagnosis not present

## 2021-06-22 DIAGNOSIS — D3500 Benign neoplasm of unspecified adrenal gland: Secondary | ICD-10-CM | POA: Diagnosis not present

## 2021-06-22 MED ORDER — GADOBENATE DIMEGLUMINE 529 MG/ML IV SOLN
14.0000 mL | Freq: Once | INTRAVENOUS | Status: AC | PRN
  Administered 2021-06-22: 14 mL via INTRAVENOUS

## 2021-07-09 DIAGNOSIS — M109 Gout, unspecified: Secondary | ICD-10-CM | POA: Diagnosis not present

## 2021-07-09 DIAGNOSIS — I10 Essential (primary) hypertension: Secondary | ICD-10-CM | POA: Diagnosis not present

## 2021-09-08 DIAGNOSIS — I10 Essential (primary) hypertension: Secondary | ICD-10-CM | POA: Diagnosis not present

## 2021-09-08 DIAGNOSIS — E559 Vitamin D deficiency, unspecified: Secondary | ICD-10-CM | POA: Diagnosis not present

## 2021-09-08 DIAGNOSIS — E790 Hyperuricemia without signs of inflammatory arthritis and tophaceous disease: Secondary | ICD-10-CM | POA: Diagnosis not present

## 2021-09-11 DIAGNOSIS — E559 Vitamin D deficiency, unspecified: Secondary | ICD-10-CM | POA: Diagnosis not present

## 2021-09-11 DIAGNOSIS — R634 Abnormal weight loss: Secondary | ICD-10-CM | POA: Diagnosis not present

## 2021-09-11 DIAGNOSIS — Z0000 Encounter for general adult medical examination without abnormal findings: Secondary | ICD-10-CM | POA: Diagnosis not present

## 2021-09-11 DIAGNOSIS — Z23 Encounter for immunization: Secondary | ICD-10-CM | POA: Diagnosis not present

## 2021-09-11 DIAGNOSIS — I10 Essential (primary) hypertension: Secondary | ICD-10-CM | POA: Diagnosis not present

## 2021-09-11 DIAGNOSIS — M109 Gout, unspecified: Secondary | ICD-10-CM | POA: Diagnosis not present

## 2021-09-11 DIAGNOSIS — E790 Hyperuricemia without signs of inflammatory arthritis and tophaceous disease: Secondary | ICD-10-CM | POA: Diagnosis not present

## 2021-12-28 IMAGING — MR MR ABDOMEN WO/W CM
15 of 17 series · 41 of 48 positions shown · IV contrast (14 ml multihance)
Comparison: CT chest abdomen pelvis, 06/02/2021

CLINICAL DATA: Characterize adrenal nodules identified by prior CT

EXAM:
MRI ABDOMEN WITHOUT AND WITH CONTRAST
TECHNIQUE: Multiplanar multisequence MR imaging of the abdomen was performed
both before and after the administration of intravenous contrast.
CONTRAST:  14mL MULTIHANCE GADOBENATE DIMEGLUMINE 529 MG/ML IV SOLN

[Series 3: cor haste · coronal · 5.0mm · 0.68mm/px · 2 of 27 slices shown]
[im 1/27]
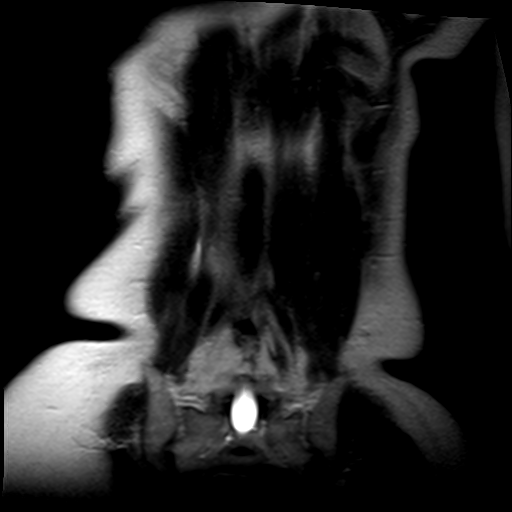
[im 27/27]
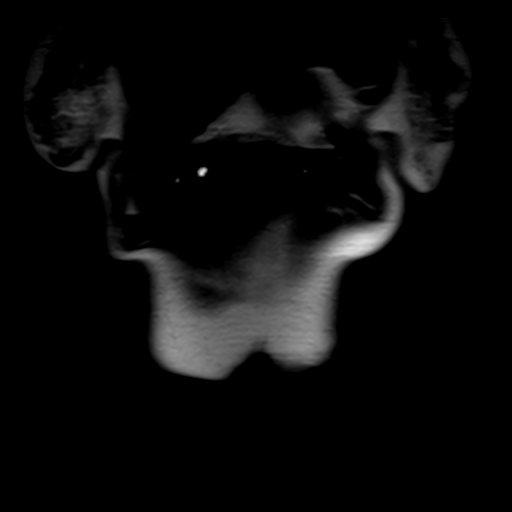

[Series 4: axial haste · axial · 5.0mm · 0.68mm/px · z∈[-76,+99]mm · 2 of 33 slices shown]
[im 1/33]
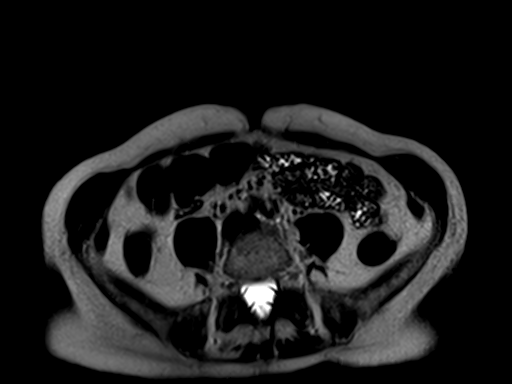
[im 33/33]
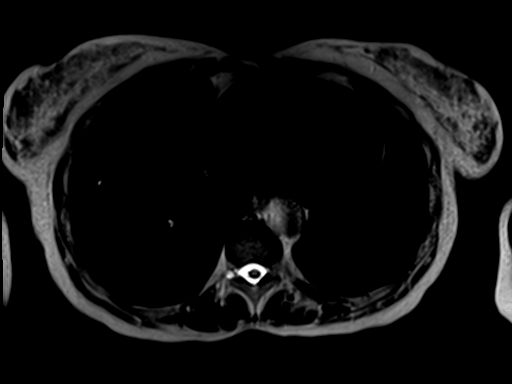

[Series 5: T1 · axial · 5.5mm · 0.68mm/px · z∈[-84,+107]mm · 4 of 66 slices shown (1 of 2)]
[im 1/66]
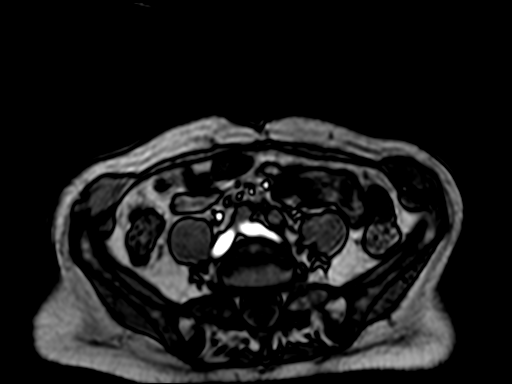
[im 22/66]
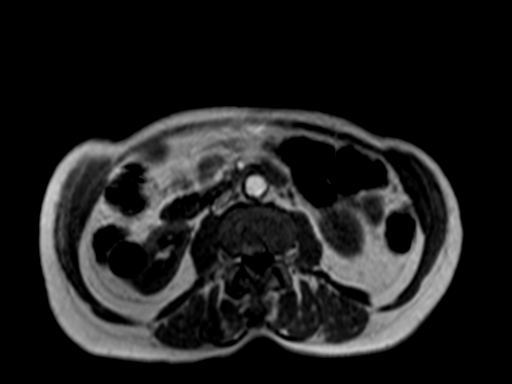
[im 44/66]
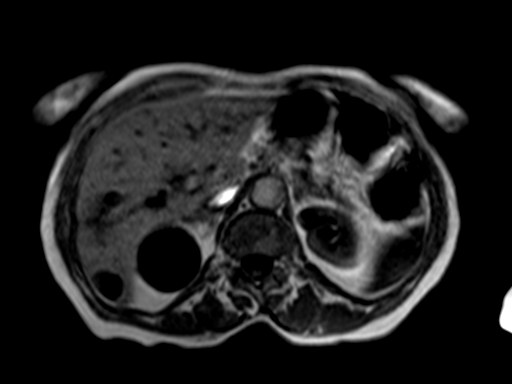
[im 66/66]
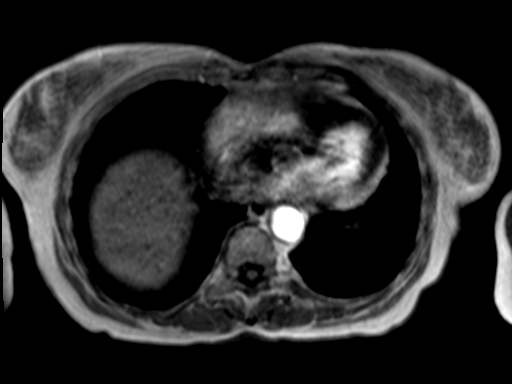

[Series 6: T2 · axial · 5.0mm · 0.46mm/px · z∈[-76,+99]mm · 2 of 33 slices shown]
[im 1/33]
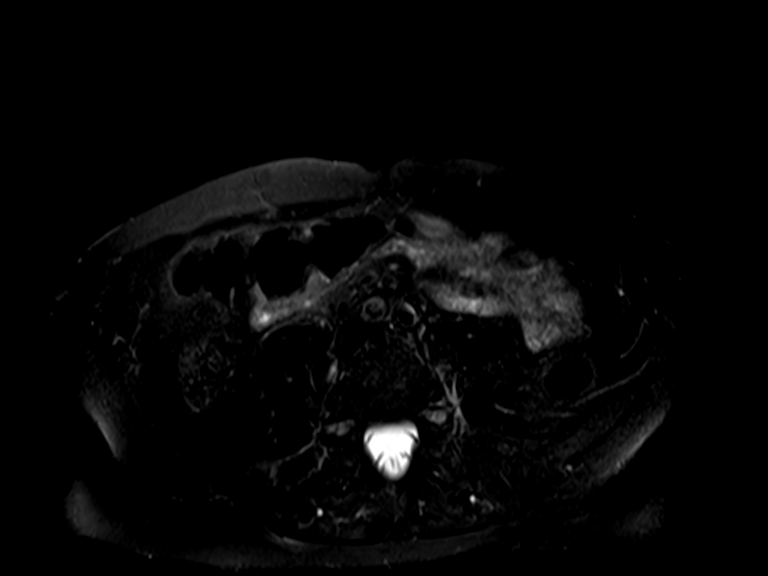
[im 33/33]
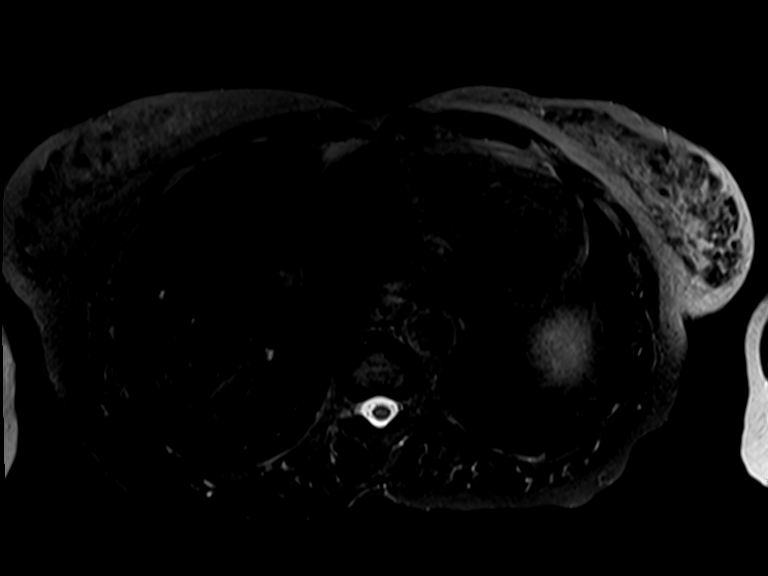

[Series 7: ep2d_diff_b50_500_800_p2_trig · axial · 5.0mm · 1.82mm/px · z∈[-76,+99]mm · 5 of 99 slices shown]
[im 1/99]
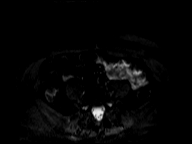
[im 25/99]
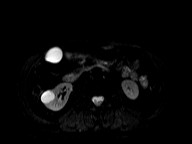
[im 50/99]
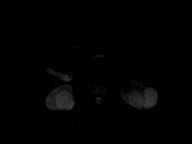
[im 74/99]
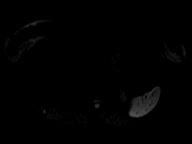
[im 99/99]
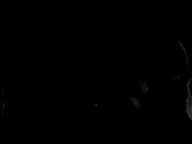

[Series 8: ep2d_diff_b50_500_800_p2_trig_adc · axial · 5.0mm · 1.82mm/px · z∈[-76,+99]mm · 2 of 33 slices shown]
[im 1/33]
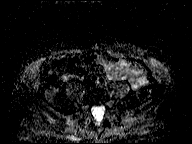
[im 33/33]
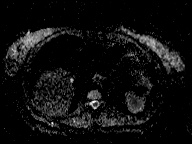

[Series 9: T1 · coronal · 4.0mm · 0.78mm/px · 2 of 44 slices shown (2 of 2)]
[im 1/44]
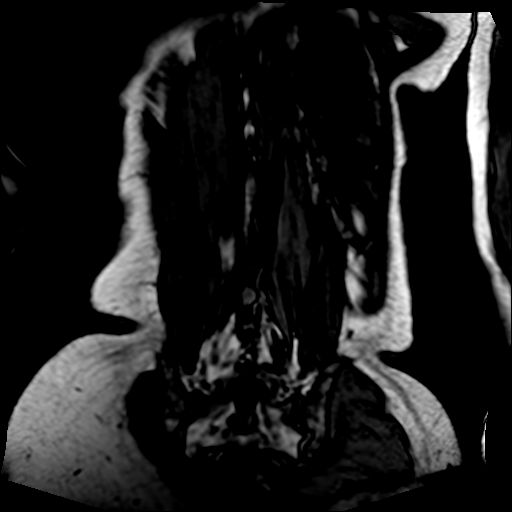
[im 44/44]
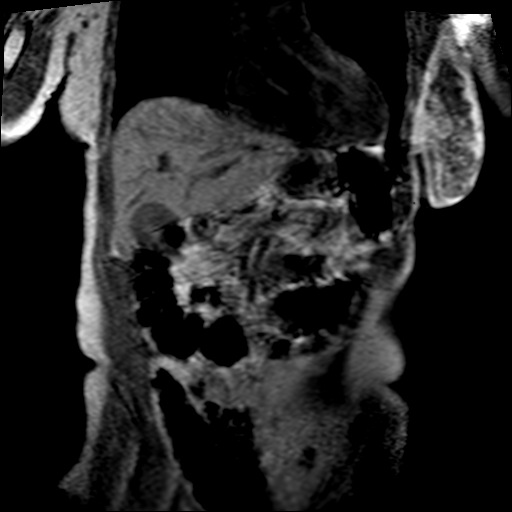

[Series 10: T1 dynamic · axial · non-contrast · 2.3mm · 1.37mm/px · z∈[-66,+114]mm · 3 of 80 slices shown]
[im 1/80]
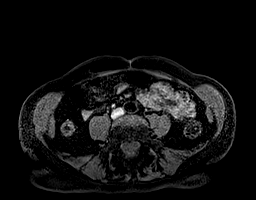
[im 40/80]
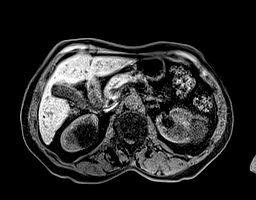
[im 80/80]
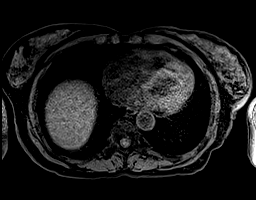

[Series 11: post 25 sec · axial · 2.3mm · 1.37mm/px · z∈[-66,+114]mm · 3 of 80 slices shown]
[im 1/80]
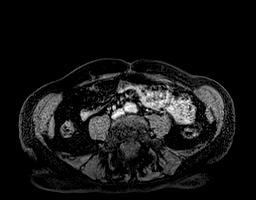
[im 40/80]
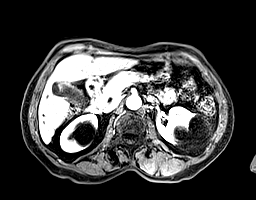
[im 80/80]
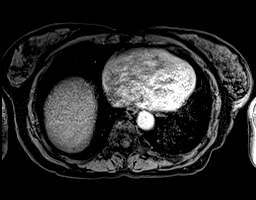

[Series 12: post 25 sec_sub · axial · 2.3mm · 1.37mm/px · z∈[-66,+114]mm · 3 of 80 slices shown]
[im 1/80]
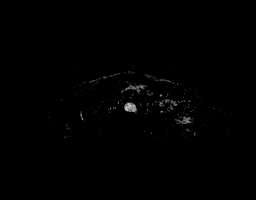
[im 40/80]
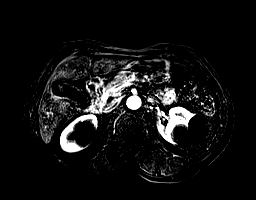
[im 80/80]
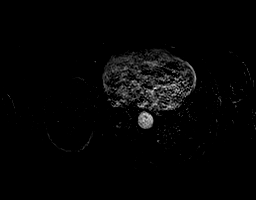

[Series 13: post 45 sec · axial · 2.3mm · 1.37mm/px · z∈[-66,+114]mm · 3 of 80 slices shown]
[im 1/80]
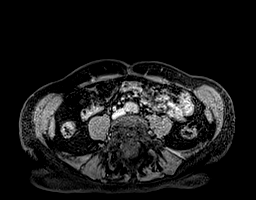
[im 40/80]
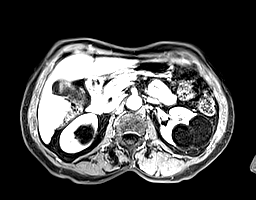
[im 80/80]
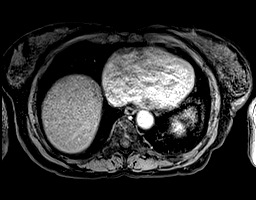

[Series 14: post 45 sec_sub · axial · 2.3mm · 1.37mm/px · z∈[-66,+114]mm · 3 of 80 slices shown]
[im 1/80]
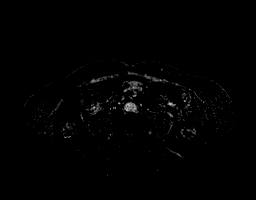
[im 40/80]
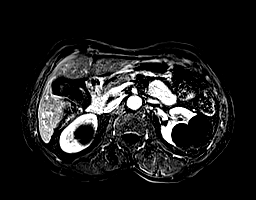
[im 80/80]
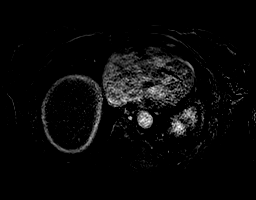

[Series 15: post 90 sec · axial · 2.3mm · 1.37mm/px · z∈[-66,+114]mm · 3 of 80 slices shown]
[im 1/80]
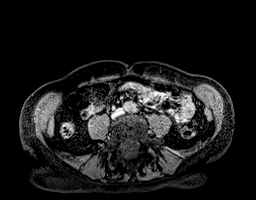
[im 40/80]
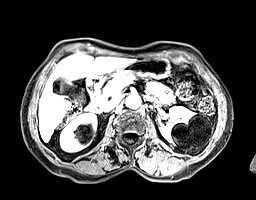
[im 80/80]
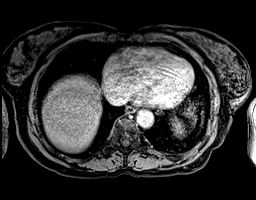

[Series 16: post 90 sec_sub · axial · 2.3mm · 1.37mm/px · z∈[-66,+114]mm · 3 of 80 slices shown]
[im 1/80]
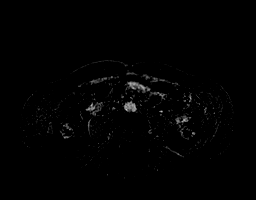
[im 40/80]
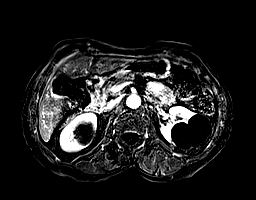
[im 80/80]
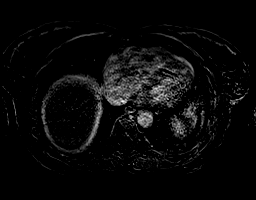

[Series 17: T1 dynamic post-contrast · coronal · 2.5mm · 0.68mm/px · 1 of 56 slices shown]
[im 1/56]
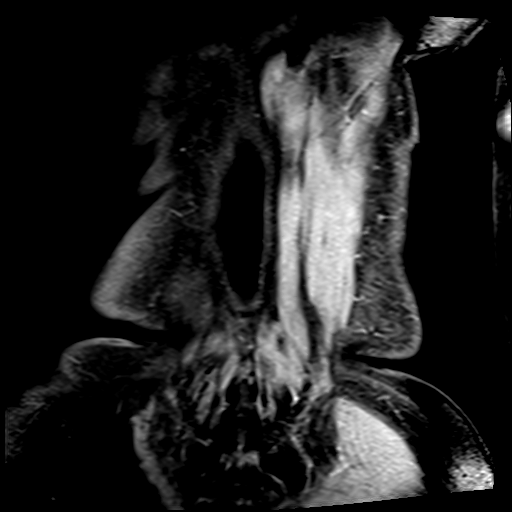

[41 of 48 positions shown; findings below may reference images not displayed]

FINDINGS: Lower chest: No acute findings.

Hepatobiliary: No mass or other parenchymal abnormality identified.
Multiple fluid signal cysts throughout the liver parenchyma. No
gallstones. No biliary ductal dilatation.

Pancreas: No mass, inflammatory changes, or other parenchymal
abnormality identified. No pancreatic ductal dilatation.

Spleen:  Within normal limits in size and appearance.

Adrenals/Urinary Tract: Macroscopic fat containing, definitively
benign adenomatous thickening of bilateral adrenal glands. No masses
identified. Multiple bilateral fluid signal, nonenhancing renal
cysts, some of which are thinly septated. No evidence of
hydronephrosis.

Stomach/Bowel: Visualized portions within the abdomen are
unremarkable.

Vascular/Lymphatic: No pathologically enlarged lymph nodes
identified. No abdominal aortic aneurysm demonstrated.

Other:  None.

Musculoskeletal: No suspicious bone lesions identified.
IMPRESSION: 1. Macroscopic fat containing, definitively benign adenomatous
thickening of the bilateral adrenal glands. No further follow-up or
characterization is required.
2. Multiple bilateral renal and hepatic cysts, definitively benign
findings for which no further follow-up or characterization is
required.

## 2022-03-12 DIAGNOSIS — M109 Gout, unspecified: Secondary | ICD-10-CM | POA: Diagnosis not present

## 2022-03-12 DIAGNOSIS — I10 Essential (primary) hypertension: Secondary | ICD-10-CM | POA: Diagnosis not present

## 2022-09-17 DIAGNOSIS — Z136 Encounter for screening for cardiovascular disorders: Secondary | ICD-10-CM | POA: Diagnosis not present

## 2022-09-17 DIAGNOSIS — E559 Vitamin D deficiency, unspecified: Secondary | ICD-10-CM | POA: Diagnosis not present

## 2022-09-17 DIAGNOSIS — M810 Age-related osteoporosis without current pathological fracture: Secondary | ICD-10-CM | POA: Diagnosis not present

## 2022-09-17 DIAGNOSIS — Z13220 Encounter for screening for lipoid disorders: Secondary | ICD-10-CM | POA: Diagnosis not present

## 2022-09-17 DIAGNOSIS — I10 Essential (primary) hypertension: Secondary | ICD-10-CM | POA: Diagnosis not present

## 2022-09-17 DIAGNOSIS — Z23 Encounter for immunization: Secondary | ICD-10-CM | POA: Diagnosis not present

## 2022-09-17 DIAGNOSIS — M109 Gout, unspecified: Secondary | ICD-10-CM | POA: Diagnosis not present

## 2022-09-17 DIAGNOSIS — Z0000 Encounter for general adult medical examination without abnormal findings: Secondary | ICD-10-CM | POA: Diagnosis not present

## 2022-09-21 ENCOUNTER — Other Ambulatory Visit: Payer: Self-pay | Admitting: Family Medicine

## 2022-09-21 DIAGNOSIS — M810 Age-related osteoporosis without current pathological fracture: Secondary | ICD-10-CM

## 2022-10-26 ENCOUNTER — Other Ambulatory Visit: Payer: Medicare Other

## 2023-03-07 ENCOUNTER — Inpatient Hospital Stay: Admission: RE | Admit: 2023-03-07 | Payer: Medicare Other | Source: Ambulatory Visit

## 2023-03-18 ENCOUNTER — Ambulatory Visit: Payer: Self-pay | Admitting: Podiatry

## 2023-03-22 DIAGNOSIS — I700 Atherosclerosis of aorta: Secondary | ICD-10-CM | POA: Diagnosis not present

## 2023-03-22 DIAGNOSIS — Z23 Encounter for immunization: Secondary | ICD-10-CM | POA: Diagnosis not present

## 2023-03-22 DIAGNOSIS — J302 Other seasonal allergic rhinitis: Secondary | ICD-10-CM | POA: Diagnosis not present

## 2023-03-22 DIAGNOSIS — I10 Essential (primary) hypertension: Secondary | ICD-10-CM | POA: Diagnosis not present

## 2023-03-28 ENCOUNTER — Encounter: Payer: Self-pay | Admitting: Podiatry

## 2023-03-28 ENCOUNTER — Ambulatory Visit (INDEPENDENT_AMBULATORY_CARE_PROVIDER_SITE_OTHER): Payer: Medicare Other | Admitting: Podiatry

## 2023-03-28 DIAGNOSIS — M79675 Pain in left toe(s): Secondary | ICD-10-CM | POA: Diagnosis not present

## 2023-03-28 DIAGNOSIS — B351 Tinea unguium: Secondary | ICD-10-CM | POA: Diagnosis not present

## 2023-03-28 DIAGNOSIS — L600 Ingrowing nail: Secondary | ICD-10-CM

## 2023-03-28 DIAGNOSIS — M79674 Pain in right toe(s): Secondary | ICD-10-CM | POA: Diagnosis not present

## 2023-03-29 NOTE — Progress Notes (Signed)
Subjective:   Patient ID: Kathy Bond, female   DOB: 82 y.o.   MRN: [id number]   HPI Patient presents with chronic nail disease 1-5 both feet and concerns because she lost the big toenail left and wants to make sure there is no infection   Review of Systems  All other systems reviewed and are negative.       Objective:  Physical Exam Vitals and nursing note reviewed.  Constitutional:      Appearance: She is well-developed.  Pulmonary:     Effort: Pulmonary effort is normal.  Musculoskeletal:        General: Normal range of motion.  Skin:    General: Skin is warm.  Neurological:     Mental Status: She is alert.     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  Today I debrided nailbeds 1-5 right 2 through 5 left no angiogenic bleeding reappoint routine care

## 2023-09-15 ENCOUNTER — Other Ambulatory Visit: Payer: Self-pay | Admitting: Family Medicine

## 2023-09-15 DIAGNOSIS — E7800 Pure hypercholesterolemia, unspecified: Secondary | ICD-10-CM | POA: Diagnosis not present

## 2023-09-15 DIAGNOSIS — Z0000 Encounter for general adult medical examination without abnormal findings: Secondary | ICD-10-CM | POA: Diagnosis not present

## 2023-09-15 DIAGNOSIS — M810 Age-related osteoporosis without current pathological fracture: Secondary | ICD-10-CM

## 2023-09-15 DIAGNOSIS — E559 Vitamin D deficiency, unspecified: Secondary | ICD-10-CM | POA: Diagnosis not present

## 2023-09-15 DIAGNOSIS — Z23 Encounter for immunization: Secondary | ICD-10-CM | POA: Diagnosis not present

## 2023-09-15 DIAGNOSIS — I10 Essential (primary) hypertension: Secondary | ICD-10-CM | POA: Diagnosis not present

## 2023-09-15 DIAGNOSIS — M109 Gout, unspecified: Secondary | ICD-10-CM | POA: Diagnosis not present

## 2024-04-26 ENCOUNTER — Ambulatory Visit
Admission: RE | Admit: 2024-04-26 | Discharge: 2024-04-26 | Disposition: A | Discharge status: Home / Self Care | Payer: Medicare Other | Source: Ambulatory Visit | Attending: Family Medicine | Admitting: Family Medicine

## 2024-04-26 DIAGNOSIS — M8588 Other specified disorders of bone density and structure, other site: Secondary | ICD-10-CM | POA: Diagnosis not present

## 2024-04-26 DIAGNOSIS — M810 Age-related osteoporosis without current pathological fracture: Secondary | ICD-10-CM

## 2024-04-28 DIAGNOSIS — I10 Essential (primary) hypertension: Secondary | ICD-10-CM | POA: Diagnosis not present

## 2024-04-28 DIAGNOSIS — M810 Age-related osteoporosis without current pathological fracture: Secondary | ICD-10-CM | POA: Diagnosis not present

## 2024-04-28 DIAGNOSIS — E7800 Pure hypercholesterolemia, unspecified: Secondary | ICD-10-CM | POA: Diagnosis not present

## 2024-05-10 DIAGNOSIS — M810 Age-related osteoporosis without current pathological fracture: Secondary | ICD-10-CM | POA: Diagnosis not present

## 2024-05-28 DIAGNOSIS — E7800 Pure hypercholesterolemia, unspecified: Secondary | ICD-10-CM | POA: Diagnosis not present

## 2024-05-28 DIAGNOSIS — M810 Age-related osteoporosis without current pathological fracture: Secondary | ICD-10-CM | POA: Diagnosis not present

## 2024-05-28 DIAGNOSIS — I10 Essential (primary) hypertension: Secondary | ICD-10-CM | POA: Diagnosis not present

## 2024-06-28 DIAGNOSIS — E7800 Pure hypercholesterolemia, unspecified: Secondary | ICD-10-CM | POA: Diagnosis not present

## 2024-06-28 DIAGNOSIS — I10 Essential (primary) hypertension: Secondary | ICD-10-CM | POA: Diagnosis not present

## 2024-06-28 DIAGNOSIS — M810 Age-related osteoporosis without current pathological fracture: Secondary | ICD-10-CM | POA: Diagnosis not present

## 2024-07-29 DIAGNOSIS — M810 Age-related osteoporosis without current pathological fracture: Secondary | ICD-10-CM | POA: Diagnosis not present

## 2024-07-29 DIAGNOSIS — I10 Essential (primary) hypertension: Secondary | ICD-10-CM | POA: Diagnosis not present

## 2024-07-29 DIAGNOSIS — E7800 Pure hypercholesterolemia, unspecified: Secondary | ICD-10-CM | POA: Diagnosis not present

## 2024-08-28 DIAGNOSIS — M810 Age-related osteoporosis without current pathological fracture: Secondary | ICD-10-CM | POA: Diagnosis not present

## 2024-08-28 DIAGNOSIS — I10 Essential (primary) hypertension: Secondary | ICD-10-CM | POA: Diagnosis not present

## 2024-08-28 DIAGNOSIS — E7800 Pure hypercholesterolemia, unspecified: Secondary | ICD-10-CM | POA: Diagnosis not present

## 2024-09-18 DIAGNOSIS — I10 Essential (primary) hypertension: Secondary | ICD-10-CM | POA: Diagnosis not present

## 2024-09-18 DIAGNOSIS — E559 Vitamin D deficiency, unspecified: Secondary | ICD-10-CM | POA: Diagnosis not present

## 2024-09-18 DIAGNOSIS — E7800 Pure hypercholesterolemia, unspecified: Secondary | ICD-10-CM | POA: Diagnosis not present

## 2024-09-18 DIAGNOSIS — M109 Gout, unspecified: Secondary | ICD-10-CM | POA: Diagnosis not present

## 2024-09-18 DIAGNOSIS — Z23 Encounter for immunization: Secondary | ICD-10-CM | POA: Diagnosis not present

## 2024-09-18 DIAGNOSIS — M810 Age-related osteoporosis without current pathological fracture: Secondary | ICD-10-CM | POA: Diagnosis not present

## 2024-09-18 DIAGNOSIS — Z0000 Encounter for general adult medical examination without abnormal findings: Secondary | ICD-10-CM | POA: Diagnosis not present
